# Patient Record
Sex: Female | Born: 1955 | Race: White | Hispanic: No | Marital: Married | Smoking: Never smoker
Health system: Southern US, Community
[De-identification: ages and names within clinical notes are randomized; demographics above are authoritative.]

## PROBLEM LIST (undated history)

## (undated) DIAGNOSIS — E785 Hyperlipidemia, unspecified: Secondary | ICD-10-CM

## (undated) DIAGNOSIS — Z8619 Personal history of other infectious and parasitic diseases: Secondary | ICD-10-CM

## (undated) DIAGNOSIS — G4700 Insomnia, unspecified: Secondary | ICD-10-CM

## (undated) HISTORY — DX: Insomnia, unspecified: G47.00

## (undated) HISTORY — DX: Hyperlipidemia, unspecified: E78.5

## (undated) HISTORY — PX: BREAST BIOPSY: SHX20

## (undated) HISTORY — DX: Personal history of other infectious and parasitic diseases: Z86.19

---

## 2017-01-05 ENCOUNTER — Encounter: Payer: Self-pay | Admitting: Family

## 2017-01-05 ENCOUNTER — Ambulatory Visit (INDEPENDENT_AMBULATORY_CARE_PROVIDER_SITE_OTHER): Payer: BLUE CROSS/BLUE SHIELD | Admitting: Family

## 2017-01-05 VITALS — BP 113/73 | HR 89 | Temp 98.5°F | Resp 16 | Ht 66.0 in | Wt 133.4 lb

## 2017-01-05 DIAGNOSIS — E785 Hyperlipidemia, unspecified: Secondary | ICD-10-CM

## 2017-01-05 DIAGNOSIS — G4700 Insomnia, unspecified: Secondary | ICD-10-CM | POA: Insufficient documentation

## 2017-01-05 DIAGNOSIS — Z808 Family history of malignant neoplasm of other organs or systems: Secondary | ICD-10-CM | POA: Insufficient documentation

## 2017-01-05 DIAGNOSIS — L240 Irritant contact dermatitis due to detergents: Secondary | ICD-10-CM

## 2017-01-05 DIAGNOSIS — L259 Unspecified contact dermatitis, unspecified cause: Secondary | ICD-10-CM | POA: Insufficient documentation

## 2017-01-05 DIAGNOSIS — Z800 Family history of malignant neoplasm of digestive organs: Secondary | ICD-10-CM | POA: Insufficient documentation

## 2017-01-05 LAB — LIPID PANEL
CHOL/HDL RATIO: 2
Cholesterol: 156 mg/dL (ref 0–200)
HDL: 63 mg/dL (ref >39.00)
LDL Cholesterol: 77 mg/dL (ref 0–99)
NonHDL: 93.4
Triglycerides: 81 mg/dL (ref 0.0–149.0)
VLDL: 16.2 mg/dL (ref 0.0–40.0)

## 2017-01-05 MED ORDER — TRIAMCINOLONE ACETONIDE 0.1 % EX CREA: 1.0000 "application " | TOPICAL_CREAM | Freq: Every day | CUTANEOUS | 1 refills | Status: AC | PRN

## 2017-01-05 MED ORDER — ASPIRIN 81 MG PO TABS: 81.0000 mg | ORAL_TABLET | Freq: Every day | ORAL | Status: DC

## 2017-01-05 MED ORDER — TRAZODONE HCL 50 MG PO TABS: 50.0000 mg | ORAL_TABLET | Freq: Every day | ORAL | 1 refills | Status: DC

## 2017-01-05 MED ORDER — PRAVASTATIN SODIUM 20 MG PO TABS: 20.0000 mg | ORAL_TABLET | Freq: Every evening | ORAL | 1 refills | Status: DC

## 2017-01-05 NOTE — Assessment & Plan Note (Addendum)
Obtain follow up lipid panel. Continue statin. Dad had CAD. Discussed adding aspirin 81mg  once daily for CV prevention.

## 2017-01-05 NOTE — Assessment & Plan Note (Signed)
She is screening every 5 years.  Last colo 2015.

## 2017-01-05 NOTE — Assessment & Plan Note (Signed)
Stable on trazodone. Continue same.  

## 2017-01-05 NOTE — Patient Instructions (Addendum)
Please complete lab work prior to leaving. Add aspirin 81mg  once daily for cardiac protection. Please schedule skin check with your dermatologist.   Schedule a complete physical at your convenience.  Welcome to [facility]!

## 2017-01-05 NOTE — Assessment & Plan Note (Signed)
Brother with melanoma. She has a Armed forces operational officerdermatologist. Advised pt to schedule routine skin checks with her dermatologist.

## 2017-01-05 NOTE — Progress Notes (Signed)
Subjective:    Patient ID: Jessica Ali, female    DOB: 25-Sep-1955, 61 y.o.   MRN: [id number]  HPI  [name] is a 61 yr old female who presents today to establish care. She was previously followed at [facility].  Reviewed last office note in care everwhere.   1) Hyperlipidemia- Reports that she has been on pravastatin x 2 year.    2) Insomnia- reports that trazodone helps her fall asleep.   3) Contact dermatitis- often gets rash when she sleeps on hotel sheets.  Maintained on triamcinolone prn.    4) family hx of colon cancer- last col 2015, sees [name]   Review of Systems  Constitutional: Negative for unexpected weight change.  HENT: Negative for hearing loss and rhinorrhea.   Eyes: Negative for visual disturbance.  Respiratory: Negative for cough.   Cardiovascular: Negative for leg swelling.  Gastrointestinal: Negative for constipation and diarrhea.  Genitourinary: Negative for dysuria and frequency.  Musculoskeletal: Negative for arthralgias and myalgias.  Skin:       Occasional skin rash- none today  Neurological: Negative for headaches.  Hematological: Negative for adenopathy.  Psychiatric/Behavioral:       Denies depression/anxiety   Past Medical History:  Diagnosis Date  . History of chicken pox   . Hyperlipidemia   . Insomnia      Social History   Social History  . Marital status: Married    Spouse name: N/A  . Number of children: N/A  . Years of education: N/A   Occupational History  . Not on file.   Social History Main Topics  . Smoking status: Never Smoker  . Smokeless tobacco: Never Used  . Alcohol use Not on file  . Drug use: No  . Sexual activity: Not on file   Other Topics Concern  . Not on file   Social History Narrative   Married   6 children   Works part time as Adult nurse   Enjoys reading, boating, walking    History reviewed. No pertinent surgical history.  Family History  Problem Relation Age of Onset    . Colon cancer Mother   . Hyperlipidemia Father   . Bipolar disorder Father     Allergies  Allergen Reactions  . Nitrofurantoin Nausea Only    Nausea and headache  . Sulfa Antibiotics Rash    No current outpatient prescriptions on file prior to visit.   No current facility-administered medications on file prior to visit.     BP 113/73 (BP Location: Right Arm, Cuff Size: Normal)   Pulse 89   Temp 98.5 F (36.9 C) (Oral)   Resp 16   Ht 5\' 6"  (1.676 m)   Wt 133 lb 6.4 oz (60.5 kg)   LMP 10/13/2010   SpO2 100%   BMI 21.53 kg/m        Objective:   Physical Exam  Constitutional: She is oriented to person, place, and time. She appears well-developed and well-nourished.  HENT:  Head: Normocephalic and atraumatic.  Right Ear: Tympanic membrane and ear canal normal.  Left Ear: Tympanic membrane and ear canal normal.  Cardiovascular: Normal rate, regular rhythm and normal heart sounds.   No murmur heard. Pulmonary/Chest: Effort normal and breath sounds normal. No respiratory distress. She has no wheezes.  Abdominal: Soft. Bowel sounds are normal. She exhibits no distension. There is no tenderness. There is no rebound.  Musculoskeletal: She exhibits no edema.  Lymphadenopathy:    She has no  cervical adenopathy.  Neurological: She is alert and oriented to person, place, and time.  Skin: Skin is warm and dry.  Psychiatric: She has a normal mood and affect. Her behavior is normal. Judgment and thought content normal.          Assessment & Plan:

## 2017-01-05 NOTE — Assessment & Plan Note (Signed)
Currently stable. Given rx for prn triamcinolone.

## 2017-02-23 ENCOUNTER — Ambulatory Visit (INDEPENDENT_AMBULATORY_CARE_PROVIDER_SITE_OTHER): Payer: BLUE CROSS/BLUE SHIELD | Admitting: Family

## 2017-02-23 ENCOUNTER — Encounter: Payer: Self-pay | Admitting: Family

## 2017-02-23 VITALS — BP 121/66 | HR 63 | Temp 98.1°F | Resp 16 | Ht 66.0 in | Wt 133.4 lb

## 2017-02-23 DIAGNOSIS — Z0000 Encounter for general adult medical examination without abnormal findings: Secondary | ICD-10-CM | POA: Diagnosis not present

## 2017-02-23 LAB — URINALYSIS, ROUTINE W REFLEX MICROSCOPIC
Bilirubin Urine: NEGATIVE
KETONES UR: NEGATIVE
LEUKOCYTES UA: NEGATIVE
NITRITE: NEGATIVE
Specific Gravity, Urine: 1.015 (ref 1.000–1.030)
Total Protein, Urine: NEGATIVE
URINE GLUCOSE: NEGATIVE
Urobilinogen, UA: 0.2 (ref 0.0–1.0)
pH: 6 (ref 5.0–8.0)

## 2017-02-23 LAB — CBC WITH DIFFERENTIAL/PLATELET
Basophils Absolute: 0 10*3/uL (ref 0.0–0.1)
Basophils Relative: 0.5 % (ref 0.0–3.0)
EOS ABS: 0.1 10*3/uL (ref 0.0–0.7)
EOS PCT: 2.2 % (ref 0.0–5.0)
HCT: 39.2 % (ref 36.0–46.0)
Hemoglobin: 13.1 g/dL (ref 12.0–15.0)
LYMPHS ABS: 1.6 10*3/uL (ref 0.7–4.0)
Lymphocytes Relative: 34 % (ref 12.0–46.0)
MCHC: 33.3 g/dL (ref 30.0–36.0)
MCV: 96.5 fl (ref 78.0–100.0)
MONO ABS: 0.3 10*3/uL (ref 0.1–1.0)
Monocytes Relative: 7.4 % (ref 3.0–12.0)
NEUTROS PCT: 55.9 % (ref 43.0–77.0)
Neutro Abs: 2.6 10*3/uL (ref 1.4–7.7)
Platelets: 231 10*3/uL (ref 150.0–400.0)
RBC: 4.07 Mil/uL (ref 3.87–5.11)
RDW: 12.6 % (ref 11.5–15.5)
WBC: 4.6 10*3/uL (ref 4.0–10.5)

## 2017-02-23 LAB — HEPATIC FUNCTION PANEL
ALT: 19 U/L (ref 0–35)
AST: 20 U/L (ref 0–37)
Albumin: 4.6 g/dL (ref 3.5–5.2)
Alkaline Phosphatase: 66 U/L (ref 39–117)
BILIRUBIN DIRECT: 0.1 mg/dL (ref 0.0–0.3)
BILIRUBIN TOTAL: 0.4 mg/dL (ref 0.2–1.2)
Total Protein: 6.9 g/dL (ref 6.0–8.3)

## 2017-02-23 LAB — BASIC METABOLIC PANEL
BUN: 12 mg/dL (ref 6–23)
CO2: 31 mEq/L (ref 19–32)
CREATININE: 0.65 mg/dL (ref 0.40–1.20)
Calcium: 9.7 mg/dL (ref 8.4–10.5)
Chloride: 102 mEq/L (ref 96–112)
GFR: 98.23 mL/min (ref >60.00)
GLUCOSE: 100 mg/dL — AB (ref 70–99)
POTASSIUM: 4.1 meq/L (ref 3.5–5.1)
Sodium: 139 mEq/L (ref 135–145)

## 2017-02-23 LAB — TSH: TSH: 1.04 u[IU]/mL (ref 0.35–4.50)

## 2017-02-23 NOTE — Progress Notes (Signed)
Subjective:    Patient ID: Jessica Ali, female    DOB: Sep 10, 1955, 61 y.o.   MRN: [id number]  HPI  Patient presents today for complete physical.  Immunizations: tetanus up to date.   Diet: reports healthy diet Exercise: walks almost every day Colonoscopy:  2015 Pap Smear: 2016 Mammogram: due Dexa: due    Review of Systems  Constitutional: Negative for unexpected weight change.  HENT: Negative for hearing loss.        Just getting over mild cold, mild nasal congestions  Eyes: Negative for visual disturbance.  Respiratory: Negative for cough.   Cardiovascular: Negative for leg swelling.  Gastrointestinal: Negative for blood in stool, constipation and diarrhea.  Genitourinary: Negative for dysuria, frequency and hematuria.  Musculoskeletal: Negative for arthralgias.  Skin: Negative for rash.  Neurological: Negative for headaches.  Hematological: Negative for adenopathy.  Psychiatric/Behavioral:       Denies depression/anxiety   Past Medical History:  Diagnosis Date  . History of chicken pox   . Hyperlipidemia   . Insomnia      Social History   Social History  . Marital status: Married    Spouse name: N/A  . Number of children: N/A  . Years of education: N/A   Occupational History  . Not on file.   Social History Main Topics  . Smoking status: Never Smoker  . Smokeless tobacco: Never Used  . Alcohol use Not on file  . Drug use: No  . Sexual activity: Not on file   Other Topics Concern  . Not on file   Social History Narrative   Married   6 children   Works part time as Adult nurse   Enjoys reading, boating, walking    History reviewed. No pertinent surgical history.  Family History  Problem Relation Age of Onset  . Colon cancer Mother        recurred in her 33's  . Hyperlipidemia Father   . Bipolar disorder Father   . Coronary artery disease Father   . Colon polyps Sister   . Hyperlipidemia Brother   . Hypertension Sister   .  Melanoma Brother   . Depression Daughter     Allergies  Allergen Reactions  . Nitrofurantoin Nausea Only    Nausea and headache  . Sulfa Antibiotics Rash    Current Outpatient Prescriptions on File Prior to Visit  Medication Sig Dispense Refill  . Calcium Carbonate-Vitamin D 600-400 MG-UNIT tablet Take 1 tablet by mouth daily.    . pravastatin (PRAVACHOL) 20 MG tablet Take 1 tablet (20 mg total) by mouth every evening. 90 tablet 1  . traZODone (DESYREL) 50 MG tablet Take 1 tablet (50 mg total) by mouth at bedtime. 90 tablet 1  . triamcinolone cream (KENALOG) 0.1 % Apply 1 application topically daily as needed. 30 g 1  . aspirin 81 MG tablet Take 1 tablet (81 mg total) by mouth daily. (Patient not taking: Reported on 02/23/2017) 30 tablet    No current facility-administered medications on file prior to visit.     BP 121/66 (BP Location: Right Arm, Cuff Size: Normal)   Pulse 63   Temp 98.1 F (36.7 C) (Oral)   Resp 16   Ht 5\' 6"  (1.676 m)   Wt 133 lb 6.4 oz (60.5 kg)   LMP 10/13/2010   SpO2 99%   BMI 21.53 kg/m       Objective:   Physical Exam  Physical Exam  Constitutional: She is oriented to person,  place, and time. She appears well-developed and well-nourished. No distress.  HENT:  Head: Normocephalic and atraumatic.  Right Ear: Tympanic membrane and ear canal normal.  Left Ear: Tympanic membrane and ear canal normal.  Mouth/Throat: Oropharynx is clear and moist.  Eyes: Pupils are equal, round, and reactive to light. No scleral icterus.  Neck: Normal range of motion. No thyromegaly present.  Cardiovascular: Normal rate and regular rhythm.   No murmur heard. Pulmonary/Chest: Effort normal and breath sounds normal. No respiratory distress. He has no wheezes. She has no rales. She exhibits no tenderness.  Abdominal: Soft. Bowel sounds are normal. She exhibits no distension and no mass. There is no tenderness. There is no rebound and no guarding.  Musculoskeletal: She  exhibits no edema.  Lymphadenopathy:    She has no cervical adenopathy.  Neurological: She is alert and oriented to person, place, and time. She has normal patellar reflexes. She exhibits normal muscle tone. Coordination normal.  Skin: Skin is warm and dry.  Psychiatric: She has a normal mood and affect. Her behavior is normal. Judgment and thought content normal.  Breasts: Examined lying Right: Without masses, retractions, discharge or axillary adenopathy.  Left: Without masses, retractions, discharge or axillary adenopathy.           Assessment & Plan:  Preventative Care- encouraged pt to continue healthy diet and regular exercise. Will refer for dexa mammo, advised pt to go to the pharmacy for Shingrix as we do not have in stock. Obtain routine lab work.        Assessment & Plan:

## 2017-02-26 ENCOUNTER — Encounter: Payer: Self-pay | Admitting: Family

## 2017-03-01 ENCOUNTER — Encounter: Payer: Self-pay | Admitting: Family

## 2017-03-02 ENCOUNTER — Other Ambulatory Visit: Payer: Self-pay | Admitting: Family

## 2017-03-17 ENCOUNTER — Ambulatory Visit (HOSPITAL_BASED_OUTPATIENT_CLINIC_OR_DEPARTMENT_OTHER)
Admission: RE | Admit: 2017-03-17 | Discharge: 2017-03-17 | Disposition: A | Discharge status: Home / Self Care | Payer: BLUE CROSS/BLUE SHIELD | Source: Ambulatory Visit | Attending: Family | Admitting: Family

## 2017-03-17 ENCOUNTER — Encounter (HOSPITAL_BASED_OUTPATIENT_CLINIC_OR_DEPARTMENT_OTHER): Payer: Self-pay

## 2017-03-17 ENCOUNTER — Encounter: Payer: Self-pay | Admitting: Family

## 2017-03-17 ENCOUNTER — Other Ambulatory Visit: Payer: Self-pay | Admitting: Family

## 2017-03-17 DIAGNOSIS — Z0000 Encounter for general adult medical examination without abnormal findings: Secondary | ICD-10-CM | POA: Insufficient documentation

## 2017-03-17 DIAGNOSIS — Z1231 Encounter for screening mammogram for malignant neoplasm of breast: Secondary | ICD-10-CM | POA: Diagnosis present

## 2017-03-17 DIAGNOSIS — M85852 Other specified disorders of bone density and structure, left thigh: Secondary | ICD-10-CM | POA: Insufficient documentation

## 2017-03-17 MED ORDER — CALCIUM CARBONATE-VITAMIN D 600-400 MG-UNIT PO TABS: 1.0000 | ORAL_TABLET | Freq: Two times a day (BID) | ORAL | Status: AC

## 2017-06-29 ENCOUNTER — Other Ambulatory Visit: Payer: Self-pay | Admitting: Family

## 2017-08-25 ENCOUNTER — Encounter: Payer: Self-pay | Admitting: Family

## 2017-08-26 MED ORDER — TRAZODONE HCL 50 MG PO TABS: 50.0000 mg | ORAL_TABLET | Freq: Every day | ORAL | 1 refills | Status: DC

## 2017-08-26 MED ORDER — PRAVASTATIN SODIUM 20 MG PO TABS: ORAL_TABLET | ORAL | 1 refills | Status: DC

## 2017-08-26 NOTE — Telephone Encounter (Signed)
Mailed patient prescription today. 08/26/17

## 2018-02-01 ENCOUNTER — Other Ambulatory Visit: Payer: Self-pay | Admitting: Family

## 2018-02-01 NOTE — Telephone Encounter (Signed)
Trazodone and Pravastatin refills sent to pharmacy. Pt is due for CPE in 02/23/18.  Please call pt to schedule appt. Thanks!

## 2018-12-01 IMAGING — MG DIGITAL SCREENING BILATERAL MAMMOGRAM WITH CAD
5 series · 5 of 5 positions shown · non-contrast
Comparison: Previous exam(s).

CLINICAL DATA: Screening.

EXAM:
DIGITAL SCREENING BILATERAL MAMMOGRAM WITH CAD

[R CC]
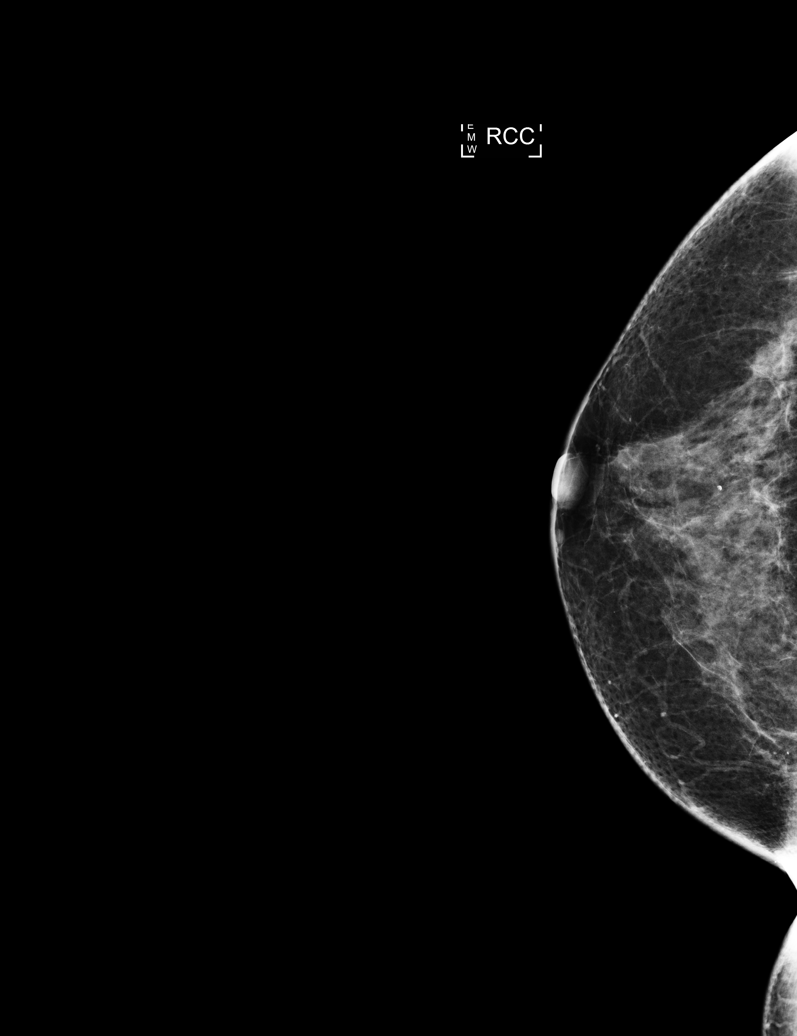

[L CC]
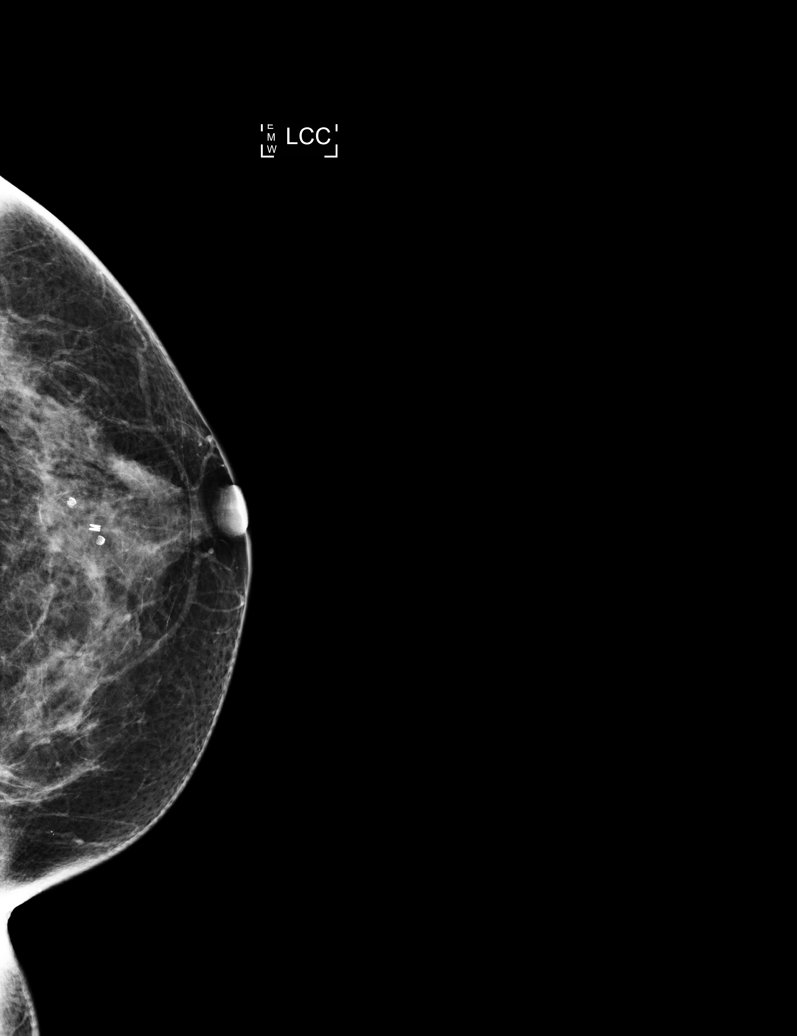

[R XCCL]
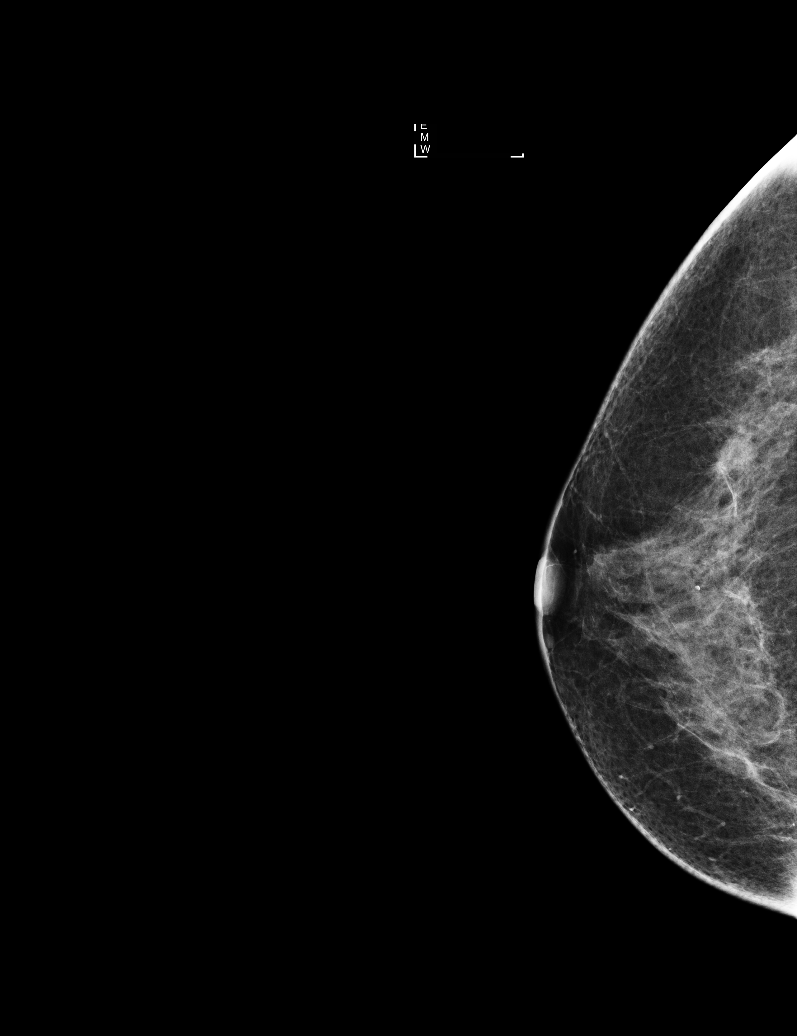

[R MLO]
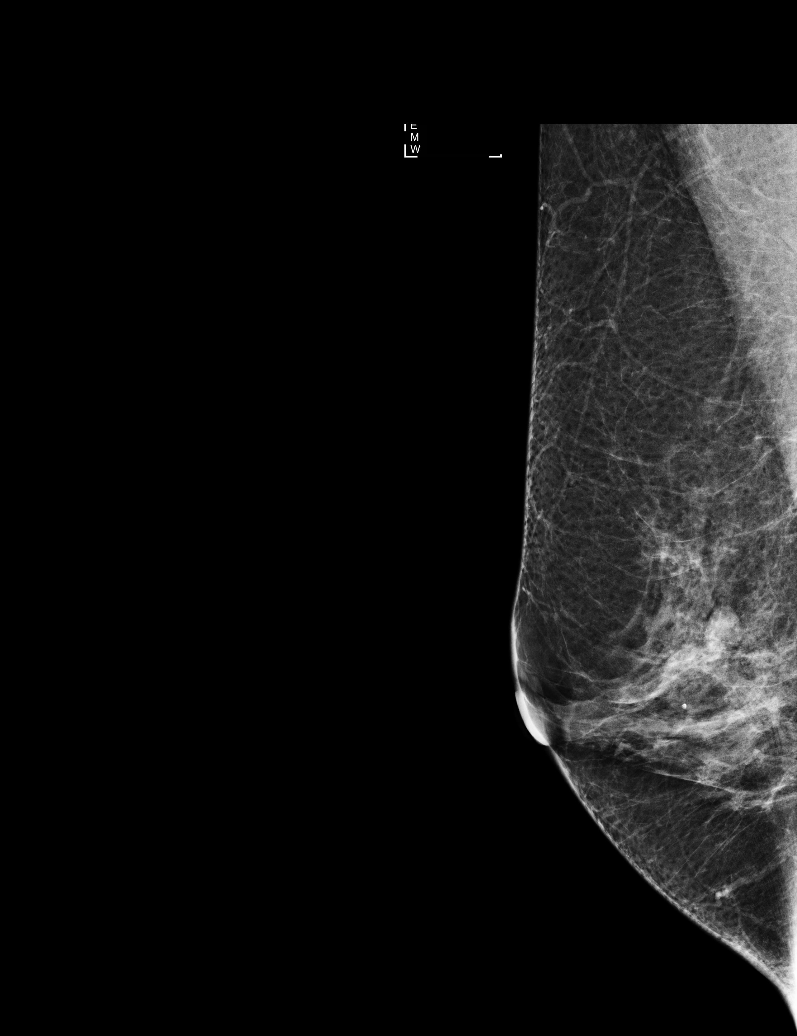

[L MLO]
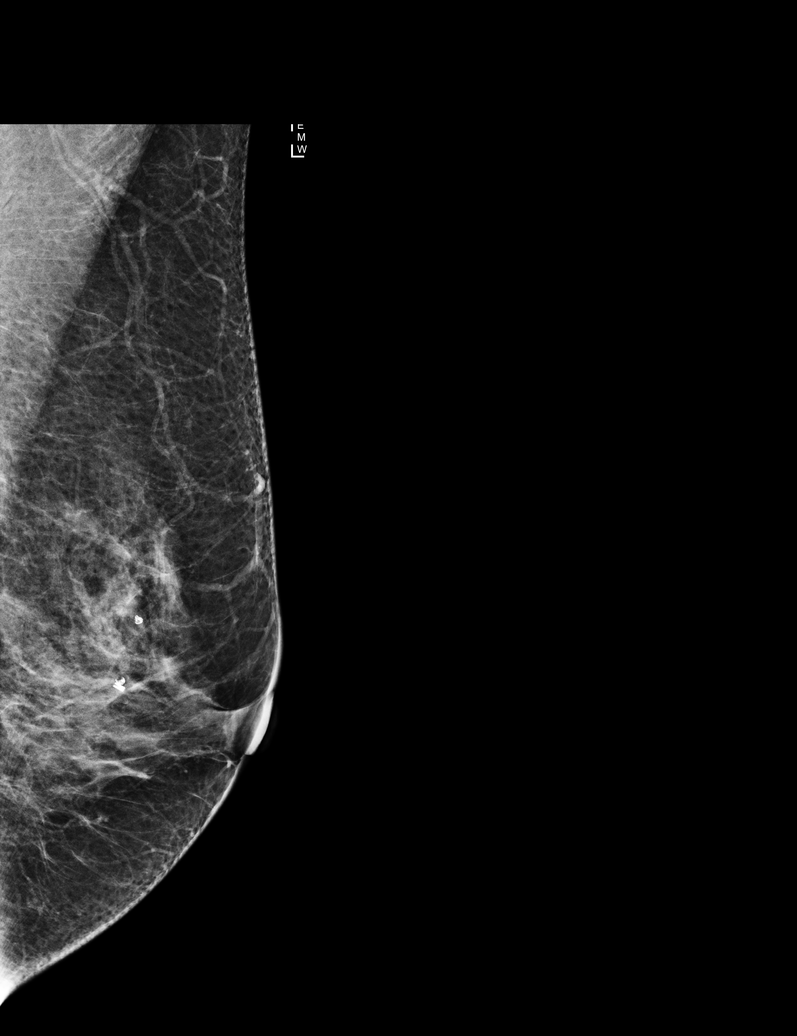

[5 of 5 positions shown; findings below may reference images not displayed]

ACR Breast Density Category c: The breast tissue is heterogeneously
dense, which may obscure small masses.
FINDINGS: There are no findings suspicious for malignancy. Images were
processed with CAD.
IMPRESSION: No mammographic evidence of malignancy. A result letter of this
screening mammogram will be mailed directly to the patient.

RECOMMENDATION:
Screening mammogram in one year. (Code:YJ-[id number])

BI-RADS CATEGORY  1: Negative.

## 2021-04-05 NOTE — Progress Notes (Signed)
NEW PATIENT Date of Service/Encounter:  04/08/21 Referring provider: Sandford Craze, NP Primary care provider: Sandford Craze, NP  Subjective:  Jessica Ali is a 65 y.o. female with a PMHx of LVH, hyperlipidemia, contact dermatitis, insomnia presenting today for evaluation of chronic conjunctivitis, now resolved. History obtained from: chart review and patient.   She had 4 months of nasal stuffiness and had unilateral right eye swelling affecting eye lids and tearing and redness.  This affected only one eye for about 3 to 4 weeks then progressed to bilateral.   She notices that it improves when she has traveled.  Her eye itched but not skin. No changes to her personal care products.   She has a history of mild seasonal allergies and takes over-the-counter antihistamines.  She avoided going outside for 2 weeks.  She stopped wearing make-up for 4 weeks.  They used dust mite protection and vaccuumed.  None of this seemed to help. Spoke with Ophthalmology on 02/27/21 for itchy eyes x 3 months and advised to start OTC pataday and systane.  This helped with itching only but not puffiness and redness. She denies any vision changes or pain in her eyes.  Other allergy screening: Asthma: no Food allergy: no Medication allergy: yes-sulfas (rash), macrobid (terrible GI upset) Hymenoptera allergy: no Urticaria: no Eczema:yes-mild, has sensitive skin, uses dye/fragrance free products and topical triamcinolone when needed (several times per year).  Has spot above her lip that is red and itchy.  It has been there on/off all year.  Will use triamcinolone once per week or less on this spot and it helps.  Past Medical History: Past Medical History:  Diagnosis Date   History of chicken pox    Hyperlipidemia    Insomnia    Medication List:  Current Outpatient Medications  Medication Sig Dispense Refill   Calcium Carbonate-Vit D-Min (CALCIUM 600+D PLUS MINERALS) 600-400 MG-UNIT TABS Take by  mouth.     Calcium Carbonate-Vitamin D 600-400 MG-UNIT tablet Take 1 tablet by mouth 2 (two) times daily.     hydrocortisone 2.5 % ointment Apply topically twice daily as need to red sandpapery rash on face. 30 g 4   pravastatin (PRAVACHOL) 20 MG tablet TAKE 1 TABLET BY MOUTH  DAILY IN THE EVENING 90 tablet 0   traZODone (DESYREL) 50 MG tablet TAKE 1 TABLET BY MOUTH AT  BEDTIME 90 tablet 0   triamcinolone cream (KENALOG) 0.1 % Apply 1 application topically daily as needed. 30 g 1   zolpidem (AMBIEN) 5 MG tablet Take by mouth.     No current facility-administered medications for this visit.   Known Allergies:  Allergies  Allergen Reactions   Nitrofurantoin Nausea Only    Nausea and headache   Sulfa Antibiotics Rash   Past Surgical History: Past Surgical History:  Procedure Laterality Date   BREAST BIOPSY     benign    Family History: Family History  Problem Relation Age of Onset   Colon cancer Mother        recurred in her 65's   Hyperlipidemia Father    Bipolar disorder Father    Coronary artery disease Father    Colon polyps Sister    Hypertension Sister    Hyperlipidemia Brother    Melanoma Brother    Allergic rhinitis Daughter    Depression Daughter    Eczema Daughter    Asthma Daughter    Eczema Son    Allergic rhinitis Son    Angioedema Neg Hx  Immunodeficiency Neg Hx    Urticaria Neg Hx    Social History: Jessica Ali lives in a home built 7 years ago, + carpet, gas heating, central AC, no pets, no visible pests, using dust mite protection, no smoke exposure.  She is retired.  + Paramedic.  [place]   ROS:  All other systems negative except as noted per HPI.  Objective:  Blood pressure 130/70, pulse 77, temperature (!) 97 F (36.1 C), temperature source Temporal, resp. rate 16, height 5\' 6"  (1.676 m), weight 138 lb (62.6 kg), last menstrual period 10/13/2010, SpO2 100 %. Body mass index is 22.27 kg/m. Physical Exam:  General  Appearance:  Alert, cooperative, no distress, appears stated age  Head:  Normocephalic, without obvious abnormality, atraumatic  Eyes:  Conjunctiva clear, EOM's intact  Nose: Nares normal, minimal turbinate hypertrophy, normal mucosa  Throat: Lips, tongue normal; teeth and gums normal, normal posterior oropharynx  Neck: Supple, symmetrical  Lungs:   CTAB, Respirations unlabored, no coughing  Heart:  RRR, no murmur, Appears well perfused  Extremities: No edema  Skin: Skin color, texture, turgor normal, erythematous dry patch above upper lip, no papules, vesicles or scaling  Neurologic: No gross deficits   Diagnostics: Skin Testing: Environmental allergy panel. Positive test to: dust mites, alternaria and histamine control. Negative test to: all others.  Results discussed with patient/family.  Airborne Adult Perc - 04/08/21 1408     Time Antigen Placed 1408    Allergen Manufacturer 04/10/21    Location Back    Number of Test 59    Panel 1 Select    1. Control-Buffer 50% Glycerol Negative    2. Control-Histamine 1 mg/ml 3+    3. Albumin saline Negative    4. Bahia Negative    5. Waynette Buttery Negative    6. Johnson Negative    7. Kentucky Blue Negative    8. Meadow Fescue Negative    9. Perennial Rye Negative    10. Sweet Vernal Negative    11. Timothy Negative    12. Cocklebur Negative    13. Burweed Marshelder Negative    14. Ragweed, short Negative    15. Ragweed, Giant Negative    16. Plantain,  English Negative    17. Lamb's Quarters Negative    18. Sheep Sorrell Negative    19. Rough Pigweed Negative    20. Marsh Elder, Rough Negative    21. Mugwort, Common Negative    22. Ash mix Negative    23. Birch mix Negative    24. Beech American Negative    25. Box, Elder Negative    26. Cedar, red Negative    27. Cottonwood, [place] Negative    28. Elm mix Negative    29. Hickory Negative    30. Maple mix Negative    31. Oak, [place] mix Negative    32. Pecan Pollen Negative     33. Pine mix Negative    34. Sycamore Eastern Negative    35. Walnut, Black Pollen Negative    36. Alternaria alternata 4+    37. Cladosporium Herbarum Negative    38. Aspergillus mix Negative    39. Penicillium mix Negative    40. Bipolaris sorokiniana (Helminthosporium) Negative    41. Drechslera spicifera (Curvularia) Negative    42. Mucor plumbeus Negative    43. Fusarium moniliforme Negative    44. Aureobasidium pullulans (pullulara) Negative    45. Rhizopus oryzae Negative    46. Botrytis  cinera Negative    47. Epicoccum nigrum Negative    48. Phoma betae Negative    49. Candida Albicans Negative    50. Trichophyton mentagrophytes Negative    51. Mite, D Farinae  5,000 AU/ml Negative    52. Mite, D Pteronyssinus  5,000 AU/ml 2+    53. Cat Hair 10,000 BAU/ml Negative    54.  Dog Epithelia Negative    55. Mixed Feathers Negative    56. Horse Epithelia Negative    57. Cockroach, German Negative    58. Mouse Negative    59. Tobacco Leaf Negative             Intradermal - 04/08/21 1452     Time Antigen Placed 1452    Allergen Manufacturer Waynette Buttery    Location Arm    Number of Test 7             Allergy testing results were read and interpreted by myself, documented by clinical staff.  Assessment:  Chronic conjunctivitis of both eyes, unspecified chronic conjunctivitis type - Plan: Allergy Test - Do not suspect contact dermatitis based on history.  Possible allergic conjunctivitis (dust mite and alternaria only positives on testing), but odd that symptoms were unilateral for several weeks prior to becoming bilateral.  Now resolved.  If recurs, discussed therapies as below but would need evaluation by ophthalmology.    Intrinsic eczema - Plan: hydrocortisone 2.5 % ointment - Also has atopic dermatitis overall mild.  Does have active dermatitis above lip. Possible contact dermatitis with mint in toothpaste-will switch to toothpaste without mint and will try home  patch test to see if this might be contributing.  No other rash on her face currently.  Plan/Recommendations:   Patient Instructions  Chronic Conjunctivitis now resolved: - allergy testing today was positive to Alteraria mold and dust mites - allergen avoidance as below - Continue Allergy Eye drops: great options include Pataday (Olopatadine) or Zaditor (ketotifen) for eye symptoms daily as needed-both sold over the counter if not covered by insurance.   -Avoid eye drops that say red eye relief as they may contain medications that dry out your eyes.  Pataday has an extra strength formulation (0.7%) - Continue over the counter antihistamine daily or daily as needed.  Your options include Zyrtec (Cetirizine) 10mg , Claritin (Loratadine) 10mg , Allegra (Fexofenadine) 180mg , or Xyzal (Levocetirinze) 5mg  - if happens again, would continue medications as above, but make appointment with ophthalmology to rule out other etiologies - can consider allergy shots if this becomes recurring problem as a long term therapy  Atopic Dermatitis:  Daily Care For Maintenance (daily and continue even once eczema controlled) - Recommend hypoallergenic hydrating ointment at least twice daily.  This must be done daily for control of flares. (Great options include Vaseline, CeraVe, Aquaphor,  - Recommend avoiding detergents, soaps or lotions with fragrances/dyes, and instead using products which are hypoallergenic - Limit showers/baths to 5 minutes and use luke warm water instead of hot, pat dry following baths, and apply moisturizer - can use steroid creams as detailed below up to twice weekly for prevention of flares.  For Flares:(add this to maintenance therapy if needed for flares) - Triamcinolone 0.1% to body for moderate flares-apply topically twice daily to red, raised areas of skin, followed by moisturizer - Hydrocortisone 2.5% to face-apply topically twice daily to red, raised areas of skin, followed by  moisturizer  -try home patch testing by using forearm, place current toothpaste on arm and wash off  after 5 minutes.  Do this twice daily for 7 days and look to see if skin becomes irritated or with rash.  If so, please switch to toothpaste without ment.   - can consider formal patch testing if continues to be a problem  Follow-up in 6 months, sooner if needed.   This note in its entirety was forwarded to the Provider who requested this consultation.  Thank you for your kind referral. I appreciate the opportunity to take part in Dejanee's care. Please do not hesitate to contact me with questions.  Sincerely,  Tonny Bollman, MD [facility]

## 2021-04-08 ENCOUNTER — Encounter: Payer: Self-pay | Admitting: Internal Medicine

## 2021-04-08 ENCOUNTER — Ambulatory Visit (INDEPENDENT_AMBULATORY_CARE_PROVIDER_SITE_OTHER): Payer: Medicare Other | Admitting: Internal Medicine

## 2021-04-08 ENCOUNTER — Other Ambulatory Visit: Payer: Self-pay

## 2021-04-08 VITALS — BP 130/70 | HR 77 | Temp 97.0°F | Resp 16 | Ht 66.0 in | Wt 138.0 lb

## 2021-04-08 DIAGNOSIS — L2084 Intrinsic (allergic) eczema: Secondary | ICD-10-CM

## 2021-04-08 DIAGNOSIS — H10403 Unspecified chronic conjunctivitis, bilateral: Secondary | ICD-10-CM | POA: Insufficient documentation

## 2021-04-08 MED ORDER — HYDROCORTISONE 2.5 % EX OINT
TOPICAL_OINTMENT | CUTANEOUS | 4 refills | Status: AC
Start: 2021-04-08 — End: ?

## 2021-04-08 NOTE — Patient Instructions (Addendum)
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   -Avoid eye drops that say red eye relief as they may contain medications that dry out your eyes.  Pataday has an extra strength formulation (0.7%) - Continue over the counter antihistamine daily or daily as needed.  Your options include Zyrtec (Cetirizine) 10mg , Claritin (Loratadine) 10mg , Allegra (Fexofenadine) 180mg , or Xyzal (Levocetirinze) 5mg  - if happens again, would continue medications as above, but make appointment with ophthalmology to rule out other etiologies - can consider allergy shots if this becomes recurring problem as a long term therapy  Atopic Dermatitis:  Daily Care For Maintenance (daily and continue even once eczema controlled) - Recommend hypoallergenic hydrating ointment at least twice daily.  This must be done daily for control of flares. (Great options include Vaseline, CeraVe, Aquaphor,  - Recommend avoiding detergents, soaps or lotions with fragrances/dyes, and instead using products which are hypoallergenic - Limit showers/baths to 5 minutes and use luke warm water instead of hot, pat dry following baths, and apply moisturizer - can use steroid creams as detailed below up to twice weekly for prevention of flares.  For Flares:(add this to maintenance therapy if needed for flares) - Triamcinolone 0.1% to body for moderate flares-apply topically twice daily to red, raised areas of skin, followed by moisturizer - Hydrocortisone 2.5% to face-apply topically twice daily to red, raised areas of skin, followed by moisturizer  -try home patch testing by using forearm, place current toothpaste on arm and wash off after 5 minutes.  Do this twice daily for 7  days and look to see if skin becomes irritated or with rash.  If so, please switch to toothpaste without ment.   For specific instructions, see below.  DUST MITE AVOIDANCE MEASURES:  There are three main measures that need and can be taken to avoid house dust mites:  Reduce accumulation of dust in general -reduce furniture, clothing, carpeting, books, stuffed animals, especially in bedroom  Separate yourself from the dust -use pillow and mattress encasements (can be found at stores such as [employer] or online) -avoid direct exposure to air condition flow -use a HEPA filter device, especially in the bedroom; you can also use a HEPA filter vacuum cleaner -wipe dust with a moist towel instead of a dry towel or broom when cleaning  Decrease mites and/or their secretions -wash clothing and linen and stuffed animals at highest temperature possible, at least every 2 weeks -stuffed animals can also be placed in a bag and put in a freezer overnight  Despite the above measures, it is impossible to eliminate dust mites or their allergen completely from your home.  With the above measures the burden of mites in your home can be diminished, with the goal of minimizing your allergic symptoms.  Success will be reached only when implementing and using all means together.  Control of Mold Allergen   Mold and fungi can grow on a variety of surfaces provided certain temperature and moisture conditions exist.  Outdoor molds grow on plants, decaying vegetation and soil.  The major outdoor mold, Alternaria and Cladosporium, are found in very high numbers during hot and dry conditions.  Generally, a late Summer - Fall peak is seen for common outdoor fungal spores.  Rain will temporarily lower outdoor mold spore count, but counts rise rapidly when the rainy period ends.  The most important indoor molds are Aspergillus and Penicillium.  Dark, humid and poorly ventilated basements are ideal sites for mold  growth.  The next most common sites of mold growth are the bathroom and the kitchen.  Outdoor (Seasonal) Mold Control  Positive outdoor molds via skin testing: Alternaria  Use air conditioning and keep windows closed Avoid exposure to decaying vegetation. Avoid leaf raking. Avoid grain handling. Consider wearing a face mask if working in moldy areas.
